# Patient Record
Sex: Male | Born: 1945 | Race: White | Hispanic: Yes | Smoking: Never smoker
Health system: Southern US, Community
[De-identification: ages and names within clinical notes are randomized; demographics above are authoritative.]

## PROBLEM LIST (undated history)

## (undated) DIAGNOSIS — G629 Polyneuropathy, unspecified: Secondary | ICD-10-CM

## (undated) DIAGNOSIS — K259 Gastric ulcer, unspecified as acute or chronic, without hemorrhage or perforation: Secondary | ICD-10-CM

## (undated) DIAGNOSIS — I10 Essential (primary) hypertension: Secondary | ICD-10-CM

## (undated) DIAGNOSIS — N289 Disorder of kidney and ureter, unspecified: Secondary | ICD-10-CM

## (undated) DIAGNOSIS — I4510 Unspecified right bundle-branch block: Secondary | ICD-10-CM

## (undated) HISTORY — PX: SHOULDER SURGERY: SHX246

## (undated) HISTORY — PX: HERNIA REPAIR: SHX51

## (undated) HISTORY — PX: KNEE ARTHROSCOPY: SUR90

## (undated) HISTORY — DX: Polyneuropathy, unspecified: G62.9

## (undated) HISTORY — PX: APPENDECTOMY: SHX54

---

## 2019-03-27 ENCOUNTER — Emergency Department (HOSPITAL_COMMUNITY): Payer: Medicare Other

## 2019-03-27 ENCOUNTER — Other Ambulatory Visit: Payer: Self-pay

## 2019-03-27 ENCOUNTER — Encounter (HOSPITAL_COMMUNITY): Payer: Self-pay

## 2019-03-27 ENCOUNTER — Emergency Department (HOSPITAL_COMMUNITY)
Admission: EM | Admit: 2019-03-27 | Discharge: 2019-03-27 | Disposition: A | Discharge status: Home / Self Care | Payer: Medicare Other | Attending: Emergency Medicine | Admitting: Emergency Medicine

## 2019-03-27 ENCOUNTER — Emergency Department (HOSPITAL_BASED_OUTPATIENT_CLINIC_OR_DEPARTMENT_OTHER): Payer: Medicare Other

## 2019-03-27 DIAGNOSIS — I129 Hypertensive chronic kidney disease with stage 1 through stage 4 chronic kidney disease, or unspecified chronic kidney disease: Secondary | ICD-10-CM | POA: Insufficient documentation

## 2019-03-27 DIAGNOSIS — R51 Headache: Secondary | ICD-10-CM | POA: Diagnosis not present

## 2019-03-27 DIAGNOSIS — R0602 Shortness of breath: Secondary | ICD-10-CM

## 2019-03-27 DIAGNOSIS — Z7982 Long term (current) use of aspirin: Secondary | ICD-10-CM | POA: Diagnosis not present

## 2019-03-27 DIAGNOSIS — R0789 Other chest pain: Secondary | ICD-10-CM

## 2019-03-27 DIAGNOSIS — N189 Chronic kidney disease, unspecified: Secondary | ICD-10-CM | POA: Insufficient documentation

## 2019-03-27 DIAGNOSIS — R52 Pain, unspecified: Secondary | ICD-10-CM

## 2019-03-27 DIAGNOSIS — Z79899 Other long term (current) drug therapy: Secondary | ICD-10-CM | POA: Insufficient documentation

## 2019-03-27 DIAGNOSIS — R030 Elevated blood-pressure reading, without diagnosis of hypertension: Secondary | ICD-10-CM

## 2019-03-27 DIAGNOSIS — M79662 Pain in left lower leg: Secondary | ICD-10-CM | POA: Insufficient documentation

## 2019-03-27 DIAGNOSIS — R519 Headache, unspecified: Secondary | ICD-10-CM

## 2019-03-27 HISTORY — DX: Unspecified right bundle-branch block: I45.10

## 2019-03-27 HISTORY — DX: Disorder of kidney and ureter, unspecified: N28.9

## 2019-03-27 HISTORY — DX: Essential (primary) hypertension: I10

## 2019-03-27 HISTORY — DX: Polyneuropathy, unspecified: G62.9

## 2019-03-27 HISTORY — DX: Gastric ulcer, unspecified as acute or chronic, without hemorrhage or perforation: K25.9

## 2019-03-27 LAB — CBC
HCT: 40.1 % (ref 39.0–52.0)
Hemoglobin: 13.3 g/dL (ref 13.0–17.0)
MCH: 30.1 pg (ref 26.0–34.0)
MCHC: 33.2 g/dL (ref 30.0–36.0)
MCV: 90.7 fL (ref 80.0–100.0)
Platelets: 188 10*3/uL (ref 150–400)
RBC: 4.42 MIL/uL (ref 4.22–5.81)
RDW: 13.7 % (ref 11.5–15.5)
WBC: 7.1 10*3/uL (ref 4.0–10.5)
nRBC: 0 % (ref 0.0–0.2)

## 2019-03-27 LAB — BASIC METABOLIC PANEL
Anion gap: 11 (ref 5–15)
BUN: 23 mg/dL (ref 8–23)
CO2: 22 mmol/L (ref 22–32)
Calcium: 9.6 mg/dL (ref 8.9–10.3)
Chloride: 104 mmol/L (ref 98–111)
Creatinine, Ser: 1.87 mg/dL — ABNORMAL HIGH (ref 0.61–1.24)
GFR calc Af Amer: 40 mL/min — ABNORMAL LOW (ref >60)
GFR calc non Af Amer: 35 mL/min — ABNORMAL LOW (ref >60)
Glucose, Bld: 121 mg/dL — ABNORMAL HIGH (ref 70–99)
Potassium: 4.4 mmol/L (ref 3.5–5.1)
Sodium: 137 mmol/L (ref 135–145)

## 2019-03-27 LAB — TROPONIN I: Troponin I: <0.03 ng/mL (ref <0.03)

## 2019-03-27 LAB — SEDIMENTATION RATE: Sed Rate: 5 mm/hr (ref 0–16)

## 2019-03-27 LAB — BRAIN NATRIURETIC PEPTIDE: B Natriuretic Peptide: 17.3 pg/mL (ref 0.0–100.0)

## 2019-03-27 MED ORDER — SODIUM CHLORIDE 0.9 % IV BOLUS
1000.0000 mL | Freq: Once | INTRAVENOUS | Status: AC
  Administered 2019-03-27: 1000 mL via INTRAVENOUS

## 2019-03-27 MED ORDER — SODIUM CHLORIDE 0.9% FLUSH
3.0000 mL | Freq: Once | INTRAVENOUS | Status: AC
  Administered 2019-03-27: 3 mL via INTRAVENOUS

## 2019-03-27 MED ORDER — PROCHLORPERAZINE EDISYLATE 10 MG/2ML IJ SOLN
10.0000 mg | Freq: Once | INTRAMUSCULAR | Status: AC
  Administered 2019-03-27: 10 mg via INTRAVENOUS
  Filled 2019-03-27: qty 2

## 2019-03-27 MED ORDER — DIPHENHYDRAMINE HCL 50 MG/ML IJ SOLN
25.0000 mg | Freq: Once | INTRAMUSCULAR | Status: AC
  Administered 2019-03-27: 25 mg via INTRAVENOUS
  Filled 2019-03-27: qty 1

## 2019-03-27 NOTE — ED Notes (Signed)
Patient transported to X-ray 

## 2019-03-27 NOTE — ED Notes (Signed)
Patient transported to CT 

## 2019-03-27 NOTE — ED Triage Notes (Signed)
Pt has multiple complaints Pt reports 1 week of intermittent headaches with HTN. Pt also reports discomfort on the left side of his chest with SOB and lightheadedness.  Woke up with pain his his left calf today, hx of PE and DVTs. Not taking any blood thinners  Traveled to [place] in October 2019, was quarantined due to similar symptoms.   Pt a.o, nad noted at this time.

## 2019-03-27 NOTE — ED Notes (Signed)
Vascular at bedside

## 2019-03-27 NOTE — ED Provider Notes (Signed)
[facility] EMERGENCY DEPARTMENT Provider Note   CSN: [id number] Arrival date & time: 03/27/19  1553    History   Chief Complaint Chief Complaint  Patient presents with   Headache   Chest Pain   Shortness of Breath   Leg Pain    HPI Hashim Eichhorst is a 73 y.o. male.     73yo M w/ PMH including HTN, CKD, PUD who p/w headache and HTN. 10 days ago he began having a headache. He reports h/o HTN and has gotten similar headaches in the past when his blood pressure is elevated. Usually his BP runs 110s/70s; checked pressure and it was 140/85. He doubled his doxazosin from 8 to  about 1 week ago. He also continues to take Cozaar. No improvement with the medication increase. He spoke with his PCP in [place] who instructed to continue those meds and he started another medication,  amlodipine. PCP also gave him lorazepam for stress. Pt noticed some improvement in BP. He reports some progressive SOB as well as some L sided chest discomfort. The pain radiates to his back and L shoulder. Sometimes it improves with exercise. He reports walking a few miles each day without problems.  This morning his headache was more severe, he was having some problems breathing, and had a cramp in his left calf which is what prompted him to come in. He states he feels uncomfortable and not his usual self. He denies cough/cold symptoms, fevers, vomiting. No vision changes, extremity weakness, balance problems, or numbness.  He denies any recent long travel or history of blood clots. He reports having similar symptoms last October for which he was admitted and had extensive w/u including ECHO, cardiac stress test, CTA chest which were normal.   The history is provided by the patient.  Headache  Chest Pain  Associated symptoms: headache and shortness of breath   Shortness of Breath  Associated symptoms: chest pain and headaches   Leg Pain    Past Medical History:  Diagnosis Date    Hypertension    Multiple gastric ulcers    Neuropathy    RBBB (right bundle branch block)    incomplete, 1st degree AV Block   Renal disorder     There are no active problems to display for this patient.   Past Surgical History:  Procedure Laterality Date   APPENDECTOMY     HERNIA REPAIR Left    inguinal    KNEE ARTHROSCOPY Right    x3   SHOULDER SURGERY          Home Medications    Prior to Admission medications   Medication Sig Start Date End Date Taking? Authorizing Provider  amLODipine (NORVASC) 5 MG tablet Take 5 mg by mouth daily. 03/16/19  Yes [provider]  aspirin EC 81 MG tablet Take 81 mg by mouth at bedtime.    Yes [provider]  doxazosin (CARDURA) 8 MG tablet Take 16 mg by mouth at bedtime.  02/28/19  Yes [provider]  gabapentin (NEURONTIN) 300 MG capsule Take 600 mg by mouth 2 (two) times daily.  02/28/19  Yes [provider]  lansoprazole (PREVACID) 15 MG capsule Take 15 mg by mouth 2 (two) times daily before a meal.   Yes [provider]  LORazepam (ATIVAN) 1 MG tablet Take 1 mg by mouth 2 (two) times daily as needed for anxiety.  03/14/19  Yes [provider]  losartan (COZAAR) 100 MG tablet Take 100  mg by mouth daily. 02/28/19  Yes [provider]  Multiple Vitamin (MULTIVITAMIN WITH MINERALS) TABS tablet Take 1 tablet by mouth daily.   Yes [provider]  triamcinolone cream (KENALOG) 0.1 % Apply 1 application topically 2 (two) times daily as needed (skin care).   Yes [provider]  ZENPEP 25000-79000 units CPEP Take 1-2 capsules by mouth See admin instructions. Take one capsule at lunch and two capsules at dinner, may add one addition capsule for heavy meals or red meat consumption. 12/25/18  Yes [provider]  atorvastatin (LIPITOR) 10 MG tablet Take 10 mg by mouth daily. 03/12/19   [provider]    Family History No family history on  file.  Social History Social History   Tobacco Use   Smoking status: Never Smoker   Smokeless tobacco: Never Used  Substance Use Topics   Alcohol use: Not Currently   Drug use: Never     Allergies   Patient has no known allergies.   Review of Systems Review of Systems  Respiratory: Positive for shortness of breath.   Cardiovascular: Positive for chest pain.  Neurological: Positive for headaches.  All other systems reviewed and are negative except that which was mentioned in HPI    Physical Exam Updated Vital Signs BP 111/80    Pulse 71    Temp 98.8 F (37.1 C) (Oral)    Resp 10    Ht 5\' 9"  (1.753 m)    Wt 79.4 kg    SpO2 96%    BMI 25.84 kg/m   Physical Exam Vitals signs and nursing note reviewed.  Constitutional:      General: He is not in acute distress.    Appearance: He is well-developed.     Comments: Awake, alert  HENT:     Head: Normocephalic and atraumatic.  Eyes:     Extraocular Movements: Extraocular movements intact.     Conjunctiva/sclera: Conjunctivae normal.     Pupils: Pupils are equal, round, and reactive to light.  Neck:     Musculoskeletal: Neck supple.  Cardiovascular:     Rate and Rhythm: Normal rate and regular rhythm.     Heart sounds: Normal heart sounds. No murmur.  Pulmonary:     Effort: Pulmonary effort is normal. No respiratory distress.     Breath sounds: Normal breath sounds.  Abdominal:     General: Bowel sounds are normal. There is no distension.     Palpations: Abdomen is soft.     Tenderness: There is no abdominal tenderness.  Skin:    General: Skin is warm and dry.  Neurological:     Mental Status: He is alert and oriented to person, place, and time.     Cranial Nerves: No cranial nerve deficit.     Motor: No abnormal muscle tone.     Deep Tendon Reflexes: Reflexes are normal and symmetric.     Comments: Fluent speech, normal finger-to-nose testing, negative pronator drift, no clonus 5/5 strength and normal  sensation x all 4 extremities  Psychiatric:        Thought Content: Thought content normal.        Judgment: Judgment normal.     Comments: Mildly anxious      ED Treatments / Results  Labs (all labs ordered are listed, but only abnormal results are displayed) Labs Reviewed  BASIC METABOLIC PANEL - Abnormal; Notable for the following components:      Result Value   Glucose, Bld 121 (*)  Creatinine, Ser 1.87 (*)    GFR calc non Af Amer 35 (*)    GFR calc Af Amer 40 (*)    All other components within normal limits  CBC  TROPONIN I  SEDIMENTATION RATE  BRAIN NATRIURETIC PEPTIDE    EKG EKG Interpretation  Date/Time:  Tuesday March 27 2019 16:00:20 EDT Ventricular Rate:  101 PR Interval:  164 QRS Duration: 92 QT Interval:  330 QTC Calculation: 427 R Axis:   66 Text Interpretation:  Sinus tachycardia Incomplete right bundle branch block Borderline ECG No previous ECGs available Confirmed by Frederick Peers [phone]) on 03/27/2019 5:01:15 PM   Radiology Dg Chest 2 View  Result Date: 03/27/2019 CLINICAL DATA:  Chest pain. EXAM: CHEST - 2 VIEW COMPARISON:  None. FINDINGS: The heart size and mediastinal contours are within normal limits. Both lungs are clear. No pneumothorax or pleural effusion is noted. The visualized skeletal structures are unremarkable. IMPRESSION: No active cardiopulmonary disease. Electronically Signed   By: Lupita Raider M.D.   On: 03/27/2019 16:29   Ct Head Wo Contrast  Result Date: 03/27/2019 CLINICAL DATA:  73 year old male with history of 1 week of mid intermittent headaches with hypertension. EXAM: CT HEAD WITHOUT CONTRAST TECHNIQUE: Contiguous axial images were obtained from the base of the skull through the vertex without intravenous contrast. COMPARISON:  No priors. FINDINGS: Brain: No evidence of acute infarction, hemorrhage, hydrocephalus, extra-axial collection or mass lesion/mass effect. Vascular: No hyperdense vessel or unexpected calcification.  Skull: Normal. Negative for fracture or focal lesion. Sinuses/Orbits: No acute finding. Other: None. IMPRESSION: 1. No acute intracranial abnormalities. The appearance of the brain is normal. Electronically Signed   By: Trudie Reed M.D.   On: 03/27/2019 18:00   Vas [place] Lower Extremity Venous (dvt) (mc And Wl 7a-7p)  Result Date: 03/27/2019  Lower Venous Study Indications: Pain, and SOB.  Risk Factors: History of pe. Performing Technologist: Toma Deiters RVS  Examination Guidelines: A complete evaluation includes B-mode imaging, spectral Doppler, color Doppler, and power Doppler as needed of all accessible portions of each vessel. Bilateral testing is considered an integral part of a complete examination. Limited examinations for reoccurring indications may be performed as noted.  +-----+---------------+---------+-----------+----------+-------+  RIGHT Compressibility Phasicity Spontaneity Properties Summary  +-----+---------------+---------+-----------+----------+-------+  CFV   Full            Yes       Yes                             +-----+---------------+---------+-----------+----------+-------+  SFJ   Full                                                      +-----+---------------+---------+-----------+----------+-------+   +---------+---------------+---------+-----------+----------+-------+  LEFT      Compressibility Phasicity Spontaneity Properties Summary  +---------+---------------+---------+-----------+----------+-------+  CFV       Full            Yes       Yes                             +---------+---------------+---------+-----------+----------+-------+  SFJ       Full                                                      +---------+---------------+---------+-----------+----------+-------+  FV Prox   Full            Yes       Yes                             +---------+---------------+---------+-----------+----------+-------+  FV Mid    Full                                                       +---------+---------------+---------+-----------+----------+-------+  FV Distal Full            Yes       Yes                             +---------+---------------+---------+-----------+----------+-------+  PFV       Full            Yes       Yes                             +---------+---------------+---------+-----------+----------+-------+  POP       Full            Yes       Yes                             +---------+---------------+---------+-----------+----------+-------+  PTV       Full                                                      +---------+---------------+---------+-----------+----------+-------+  PERO      Full                                                      +---------+---------------+---------+-----------+----------+-------+     Summary: Right: There is no evidence of a common femoral vein obstructuin. Left: There is no evidence of deep vein thrombosis in the lower extremity. No cystic structure found in the popliteal fossa.  *See table(s) above for measurements and observations. Electronically signed by Waverly Ferrari MD on 03/27/2019 at 5:47:53 PM.    Final     Procedures Procedures (including critical care time)  Medications Ordered in ED Medications  sodium chloride flush (NS) 0.9 % injection 3 mL (3 mLs Intravenous Given 03/27/19 1801)  sodium chloride 0.9 % bolus 1,000 mL (0 mLs Intravenous Stopped 03/27/19 1859)  diphenhydrAMINE (BENADRYL) injection 25 mg (25 mg Intravenous Given 03/27/19 1801)  prochlorperazine (COMPAZINE) injection 10 mg (10 mg Intravenous Given 03/27/19 1801)     Initial Impression / Assessment and Plan / ED Course  I have reviewed the triage vital signs and the nursing notes.  Pertinent labs & imaging results that were available during my care of the patient were reviewed by me and considered in my medical decision making (see chart for details).       he was well-appearing  on exam, normal neurologic exam and normal vital signs.  No red  flag symptoms to suggest subarachnoid hemorrhage.  CT of head unremarkable.  Ultrasound of leg negative for DVT.  His lab work shows normal troponin, normal BNP, creatinine 1.87 which just mildly elevated from previous values on paperwork that he has from previous hospitalization.  I reviewed his previous hospitalization paperwork when he was admitted last fall for similar symptoms.  He had an extensive cardiac and pulmonary work-up and was negative for PE, normal stress testing, normal ECHO, and no concerning findings during hospitalization. No risk factors for PE currently and normal VS make PE very unlikely.   Because his blood pressure is been normal here and he has had a slight increase in his creatinine, I recommended holding his losartan and discussing with his PCP.  He is planning on being in [place] for a while so I strongly recommended establishing medical care here.  He ultimately would benefit from evaluation from nephrology.  Discussed the importance of close PCP follow-up for trending his blood pressures and creatinine.  He is well-appearing on reassessment.  I have extensively reviewed return precautions and he is voiced understanding.  Final Clinical Impressions(s) / ED Diagnoses   Final diagnoses:  Nonintractable episodic headache, unspecified headache type  Elevated blood pressure reading  Chronic kidney disease, unspecified CKD stage  Atypical chest pain  Shortness of breath    ED Discharge Orders    None       Beverly Ferner, Ambrose [place], MD 03/28/19 [phone]

## 2019-03-27 NOTE — Discharge Instructions (Addendum)
Please hold your Cozaar  for now and continue taking doxazosin and amlodipine. If your blood pressure remains high, you can restart Cozaar at half dose (50mg ). Contact a primary care clinic to follow up as soon as possible and have kidney function and other symptoms rechecked.  Return to ER immediately for any worsening symptoms or strokelike symptoms.

## 2019-03-27 NOTE — Progress Notes (Signed)
Left lower extremity venous duplex completed. Preliminary results in Chart review CV Proc. [employer], RVS 03/27/2019, 5:24 PM

## 2019-07-07 DIAGNOSIS — R931 Abnormal findings on diagnostic imaging of heart and coronary circulation: Secondary | ICD-10-CM

## 2019-07-07 HISTORY — DX: Abnormal findings on diagnostic imaging of heart and coronary circulation: R93.1

## 2019-08-22 ENCOUNTER — Encounter: Payer: Self-pay | Admitting: Neurology

## 2019-08-22 ENCOUNTER — Telehealth: Payer: Self-pay | Admitting: Neurology

## 2019-08-22 ENCOUNTER — Other Ambulatory Visit: Payer: Self-pay

## 2019-08-22 ENCOUNTER — Ambulatory Visit (INDEPENDENT_AMBULATORY_CARE_PROVIDER_SITE_OTHER): Payer: Medicare Other | Admitting: Neurology

## 2019-08-22 ENCOUNTER — Encounter

## 2019-08-22 VITALS — BP 125/79 | HR 76 | Temp 97.7°F | Ht 69.0 in | Wt 174.5 lb

## 2019-08-22 DIAGNOSIS — M25571 Pain in right ankle and joints of right foot: Secondary | ICD-10-CM | POA: Diagnosis not present

## 2019-08-22 DIAGNOSIS — R202 Paresthesia of skin: Secondary | ICD-10-CM

## 2019-08-22 DIAGNOSIS — M25572 Pain in left ankle and joints of left foot: Secondary | ICD-10-CM | POA: Diagnosis not present

## 2019-08-22 DIAGNOSIS — G8929 Other chronic pain: Secondary | ICD-10-CM

## 2019-08-22 DIAGNOSIS — R413 Other amnesia: Secondary | ICD-10-CM | POA: Diagnosis not present

## 2019-08-22 MED ORDER — GABAPENTIN 300 MG PO CAPS: 600.0000 mg | ORAL_CAPSULE | Freq: Two times a day (BID) | ORAL | 11 refills | Status: AC

## 2019-08-22 MED ORDER — DULOXETINE HCL 30 MG PO CPEP: 30.0000 mg | ORAL_CAPSULE | Freq: Every day | ORAL | 11 refills | Status: DC

## 2019-08-22 NOTE — Addendum Note (Signed)
Addended by: Marcial Pacas on: 08/22/2019 03:18 PM   Modules accepted: Orders

## 2019-08-22 NOTE — Progress Notes (Addendum)
PATIENT: Calvin Stephens DOB: [birth year]  Chief Complaint  Patient presents with  . Polyneuropathy    Reports progressive tingling in hands and feet.  He is taking gabapentin 628m BID.  His dose has gradually increased over the years.    .Marland KitchenPCP    SWilley Blade MD     HISTORICAL  ABland Rudzinskiis a 73year old male, seen in request by his primary care physician Dr. SWilley Bladefor evaluation of numbness and tingling his hands and feet, initial evaluation was on August 22, 2019.  I have reviewed and summarized the referring note from the referring physician.  He has past medical history of hypertension, began to notice bilateral medial ankle discomfort around 2005, it remained at bilateral medial ankle, symmetric, he described as deep discomfort, [facility] nerve conduction study, which showed no significant abnormality, he was treated with gabapentin initially only 300 mg daily, over the years, he has to titrating up gabapentin, currently 1200 mg daily to have symptoms under reasonable control  Since 2019, he also noticed intermittent bilateral fingertips numbness tingling, he complains of right shoulder, neck tightness, he contributed to her right rotator cuff surgery, he does has mild low back pain, which chronic for over few decades, he denies gait abnormality, denies bowel and bladder incontinence.  He denies bilateral foot, especially plantar surface numbness tingling.  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  REVIEW OF SYSTEMS: Full 14 system review of systems performed and notable only for as above All other review of systems were negative.  ALLERGIES: No Known Allergies  HOME MEDICATIONS: Current Outpatient Medications  Medication Sig Dispense Refill  . amLODipine (NORVASC) 5 MG tablet Take 5 mg by mouth daily.    .Marland Kitchenaspirin EC 81 MG tablet Take 81 mg by mouth at bedtime.     .Marland Kitchendoxazosin (CARDURA) 8 MG tablet Take 16 mg by mouth at bedtime.     . gabapentin (NEURONTIN) 300 MG capsule Take 600 mg by mouth 2 (two) times daily.     . lansoprazole (PREVACID) 15 MG capsule Take 15 mg by mouth 2 (two) times daily before a meal.    . LORazepam (ATIVAN) 1 MG tablet Take 1 mg by mouth 2 (two) times daily as needed for anxiety.     .Marland Kitchenlosartan (COZAAR) 100 MG tablet Take 100 mg by mouth daily.    . Multiple Vitamin (MULTIVITAMIN WITH MINERALS) TABS tablet Take 1 tablet by mouth daily.    .Marland Kitchentriamcinolone cream (KENALOG) 0.1 % Apply 1 application topically 2 (two) times daily as needed (skin care).    .Marland KitchenZENPEP 25000-79000 units CPEP Take 1-2 capsules by mouth See admin instructions. Take one capsule at lunch and two capsules at dinner, may add one addition capsule for heavy meals or red meat consumption.     No current facility-administered medications for this visit.     PAST MEDICAL HISTORY: Past Medical History:  Diagnosis Date  . Hypertension   . Multiple gastric ulcers   . Neuropathy   .  Polyneuropathy   . RBBB (right bundle branch block)    incomplete, 1st degree AV Block  . Renal disorder     PAST SURGICAL HISTORY: Past Surgical History:  Procedure Laterality Date  . APPENDECTOMY    . HERNIA REPAIR Left    inguinal   . KNEE ARTHROSCOPY Right    x3  . SHOULDER SURGERY      FAMILY HISTORY: Family History  Problem Relation Age of Onset  . Kidney disease Mother   . Hypertension Father     SOCIAL HISTORY: Social History   Socioeconomic History  . Marital  status: Widowed    Spouse name: Not on file  . Number of children: 2  . Years of education: college  . Highest education level: Not on file  Occupational History  . 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 Financial resource strain: Not on file  . Food insecurity    Worry: Not on file    Inability: Not on file  . Transportation needs    Medical: Not on file    Non-medical: Not on file  Tobacco Use  . Smoking status: Never Smoker  . Smokeless tobacco: Never Used  Substance and Sexual Activity  . Alcohol use: Not Currently  . Drug use: Never  . Sexual activity: Not on file  Lifestyle  . Physical activity    Days per week: Not on file    Minutes per session: Not on file  . Stress: Not on file  Relationships  . Social Herbalist on phone: Not on file    Gets together: Not on file    Attends religious service: Not on file    Active member of club or organization: Not on file    Attends meetings of clubs or organizations: Not on file    Relationship status: Not on file  . Intimate partner violence    Fear of current or ex partner: Not on file    Emotionally abused: Not on file    Physically abused: Not on file    Forced sexual activity: Not on file  Other Topics Concern  . Not on file  Social History Narrative   College educated: MD, MBA   Lives with significant other.   No caffeine use.   Right-handed.     PHYSICAL EXAM   Vitals:   08/22/19 0743  BP: 125/79  Pulse: 76  Temp: 97.7 F (36.5 C)  Weight: 174 lb 8 oz (79.2 kg)  Height: 5' 9"  (1.753 m)    Not recorded      Body mass index is 25.77 kg/m.  PHYSICAL EXAMNIATION:  Gen: NAD, conversant, well nourised, well groomed                     Cardiovascular: Regular rate rhythm, no peripheral edema, warm, nontender. Eyes: Conjunctivae clear without exudates or hemorrhage Neck: Supple, no carotid bruits. Pulmonary: Clear to auscultation bilaterally   NEUROLOGICAL  EXAM:  MENTAL STATUS: Speech:    Speech is normal; fluent and spontaneous with normal comprehension.  Cognition:     Orientation to time, place and person     Normal recent and remote memory     Normal Attention span and concentration     Normal Language, naming, repeating,spontaneous speech     Fund of knowledge   CRANIAL NERVES: CN II: Visual fields are full to confrontation.  Pupils are round equal and briskly reactive  to light. CN III, IV, VI: extraocular movement are normal. No ptosis. CN V: Facial sensation is intact to pinprick in all 3 divisions bilaterally. Corneal responses are intact.  CN VII: Face is symmetric with normal eye closure and smile. CN VIII: Hearing is normal to causal conversation. CN IX, X: Palate elevates symmetrically. Phonation is normal. CN XI: Head turning and shoulder shrug are intact CN XII: Tongue is midline with normal movements and no atrophy.  MOTOR: There is no pronator drift of out-stretched arms. Muscle bulk and tone are normal. Muscle strength is normal.  REFLEXES: Reflexes are 2+ and symmetric at the biceps, triceps, knees, and ankles. Plantar responses are flexor.  SENSORY: Intact to light touch, pinprick, positional sensation and vibratory sensation are intact in fingers and toes.  COORDINATION: Rapid alternating movements and fine finger movements are intact. There is no dysmetria on finger-to-nose and heel-knee-shin.    GAIT/STANCE: Posture is normal. Gait is steady with normal steps, base, arm swing, and turning. Heel and toe walking are normal. Tandem gait is normal.  Romberg is absent.   DIAGNOSTIC DATA (LABS, IMAGING, TESTING) - I reviewed patient records, labs, notes, testing and imaging myself where available.   ASSESSMENT AND PLAN  Esias Mory is a 73 y.o. male   Bilateral upper and lower extremity paresthesia  The history are not typical for peripheral neuropathy,  Proceed with EMG nerve conduction study, also MRI  of cervical spine to rule out cervical spondylitic disease  Laboratory evaluation to rule out treatable etiology  Add on cymbalta 71m daily  Short-term memory loss  MRI of brain   YMarcial Pacas M.D. Ph.D.  [facility] [address] [place] [id number]: [phone]: [phone] CC: SWilley Blade MD

## 2019-08-22 NOTE — Addendum Note (Signed)
Addended by: Marcial Pacas on: 08/22/2019 01:27 PM   Modules accepted: Orders

## 2019-08-22 NOTE — Telephone Encounter (Signed)
Medicare/aarp order sent to GI. No auth they will reach out to the patient to schedule.  

## 2019-08-22 NOTE — Patient Instructions (Signed)
Duloxetine hydrochloride, a selective serotonin and norepinephrine reuptake inhibitor, exerts its antidepressant and pain inhibitory actions by potentiating the serotonergic and noradrenergic activity in the CNS.  Common side effect  Dermatologic: Diaphoresis (Up to 6% )  Endocrine metabolic: Weight loss (Adult, 1% or greater; pediatric, 14% to 15%% )  Gastrointestinal: Constipation (9% to 10% ), Decrease in appetite (Adult, 6% to 8%; pediatric, 10% to 15% ), Diarrhea (6% to 9% ), Nausea (18% to 25% ), Vomiting (Adult, 3% to 4%; pediatric, 9% to 15% ), Xerostomia (Adult, 11% to 14%; pediatric, 2% )  Neurologic: Hypersomnia, Sedated (Pediatric, 9% ), Somnolence  Other: Fatigue (Pediatric, 5% )

## 2019-08-24 LAB — PROTEIN ELECTROPHORESIS, SERUM
A/G Ratio: 1.4 (ref 0.7–1.7)
Albumin ELP: 4 g/dL (ref 2.9–4.4)
Alpha 1: 0.2 g/dL (ref 0.0–0.4)
Alpha 2: 0.8 g/dL (ref 0.4–1.0)
Beta: 1 g/dL (ref 0.7–1.3)
Gamma Globulin: 0.9 g/dL (ref 0.4–1.8)
Globulin, Total: 2.8 g/dL (ref 2.2–3.9)
Total Protein: 6.8 g/dL (ref 6.0–8.5)

## 2019-08-24 LAB — SEDIMENTATION RATE: Sed Rate: 7 mm/hr (ref 0–30)

## 2019-08-24 LAB — HEMOGLOBIN A1C
Est. average glucose Bld gHb Est-mCnc: 111 mg/dL
Hgb A1c MFr Bld: 5.5 % (ref 4.8–5.6)

## 2019-08-24 LAB — RPR: RPR Ser Ql: NONREACTIVE

## 2019-08-24 LAB — ANA W/REFLEX IF POSITIVE: Anti Nuclear Antibody (ANA): NEGATIVE

## 2019-08-24 LAB — CK: Total CK: 71 U/L (ref 41–331)

## 2019-09-08 ENCOUNTER — Other Ambulatory Visit: Payer: Self-pay

## 2019-09-08 ENCOUNTER — Ambulatory Visit
Admission: RE | Admit: 2019-09-08 | Discharge: 2019-09-08 | Disposition: A | Discharge status: Home / Self Care | Payer: Medicare Other | Source: Ambulatory Visit | Attending: Neurology | Admitting: Neurology

## 2019-09-08 DIAGNOSIS — R202 Paresthesia of skin: Secondary | ICD-10-CM

## 2019-09-08 DIAGNOSIS — R413 Other amnesia: Secondary | ICD-10-CM

## 2019-09-08 DIAGNOSIS — G8929 Other chronic pain: Secondary | ICD-10-CM

## 2019-09-08 DIAGNOSIS — M25572 Pain in left ankle and joints of left foot: Secondary | ICD-10-CM

## 2019-09-10 ENCOUNTER — Telehealth: Payer: Self-pay | Admitting: Neurology

## 2019-09-10 NOTE — Telephone Encounter (Signed)
I spoke to the patient and he verbalized understanding of the results below.  He will keep his pending appt for NCV/EMG on 10/03/2019.

## 2019-09-10 NOTE — Telephone Encounter (Signed)
Please call patient, MRI of brain showed mild supratentorium small vessel disease, there is no acute abnormalities.  MRI of cervical spine showed multiple level degenerative changes, no evidence of spinal cord or nerve root compression  IMPRESSION: This MRI of the brain without contrast shows the following: 1.   Brain volume is normal for age. 2.   Some scattered T2/flair hyperintense foci in the hemispheres consistent with minimal chronic microvascular ischemic change, typical for age. 3.   Minimal chronic sinusitis.   4.   There are no acute findings.  IMPRESSION: This MRI of the cervical spine without contrast shows the following: 1.   The spinal cord appears normal.  There is no spinal stenosis. 2.   Multilevel degenerative changes as detailed above, most pronounced at C4-C5 and C5-C6.  There is moderate foraminal narrowing to the right at C3-C4 and bilaterally at C5-C6 but there is no definite nerve root compression

## 2019-09-10 NOTE — Telephone Encounter (Signed)
Left message requesting a return call to review results.

## 2019-09-13 ENCOUNTER — Other Ambulatory Visit: Payer: Self-pay | Admitting: Neurology

## 2019-10-03 ENCOUNTER — Encounter (INDEPENDENT_AMBULATORY_CARE_PROVIDER_SITE_OTHER): Payer: Medicare Other | Admitting: Neurology

## 2019-10-03 ENCOUNTER — Other Ambulatory Visit: Payer: Self-pay

## 2019-10-03 ENCOUNTER — Ambulatory Visit (INDEPENDENT_AMBULATORY_CARE_PROVIDER_SITE_OTHER): Payer: Medicare Other | Admitting: Neurology

## 2019-10-03 DIAGNOSIS — R413 Other amnesia: Secondary | ICD-10-CM

## 2019-10-03 DIAGNOSIS — M25571 Pain in right ankle and joints of right foot: Secondary | ICD-10-CM | POA: Diagnosis not present

## 2019-10-03 DIAGNOSIS — M25572 Pain in left ankle and joints of left foot: Secondary | ICD-10-CM | POA: Diagnosis not present

## 2019-10-03 DIAGNOSIS — G8929 Other chronic pain: Secondary | ICD-10-CM

## 2019-10-03 DIAGNOSIS — R202 Paresthesia of skin: Secondary | ICD-10-CM

## 2019-10-03 DIAGNOSIS — Z0289 Encounter for other administrative examinations: Secondary | ICD-10-CM

## 2019-10-03 NOTE — Procedures (Signed)
Full Name: Calvin Stephens Gender: Male MRN #: [id number] Date of Birth: 10/16/1946    Visit Date: 10/03/2019 07:13 Age: 73 Years 67 Months Old Examining Physician: Marcial Pacas, MD  Referring Physician: Marcial Pacas, MD History:   73 year old male with chronic intermittent bilateral ankle paresthesia, intermittent involvement of upper extremity  Summary of the tests: Nerve conduction study: Bilateral sural, superficial peroneal sensory responses were normal.  Left median, ulnar sensory responses were normal.  Bilateral tibial, peroneal to EDB, left median, ulnar motor responses were normal.  Electromyography: Selected needle examinations of left lower extremity muscles, and upper extremity muscles were normal.  Conclusion: This is a normal study.  There is no electrodiagnostic evidence of large fiber peripheral neuropathy.    ------------------------------- Marcial Pacas, M.D. PhD  [facility] [place], [place] [address] Tel: [phone] Fax: [phone]        [facility]    Nerve / Sites Muscle Latency Ref. Amplitude Ref. Rel Amp Segments Distance Velocity Ref. Area    ms ms mV mV %  cm m/s m/s mVms  L Median - APB     Wrist APB 3.0 ?4.4 8.7 ?4.0 100 Wrist - APB 7   25.2     Upper arm APB 7.0  8.2  93.3 Upper arm - Wrist 22 55 ?49 23.1  L Ulnar - ADM     Wrist ADM 2.6 ?3.3 10.5 ?6.0 100 Wrist - ADM 7   29.1     B.Elbow ADM 6.1  9.2  88.4 B.Elbow - Wrist 20 56 ?49 28.2     A.Elbow ADM 8.1  8.9  96.2 A.Elbow - B.Elbow 10 52 ?49 28.9         A.Elbow - Wrist      L Peroneal - EDB     Ankle EDB 4.7 ?6.5 2.8 ?2.0 100 Ankle - EDB 9   9.6     Fib head EDB 11.3  2.2  81.2 Fib head - Ankle 29 44 ?44 8.8     Pop fossa EDB 13.5  2.1  92 Pop fossa - Fib head 10 44 ?44 8.5         Pop fossa - Ankle      R Peroneal - EDB     Ankle EDB 4.8 ?6.5 2.3 ?2.0 100 Ankle - EDB 9   7.4     Fib head EDB 11.5  2.5  109 Fib head - Ankle 29 44 ?44 8.6     Pop fossa EDB  13.8  2.3  94.5 Pop fossa - Fib head 10 44 ?44 8.3         Pop fossa - Ankle      L Tibial - AH     Ankle AH 3.8 ?5.8 7.0 ?4.0 100 Ankle - AH 9   14.8     Pop fossa AH 13.4  5.1  73.3 Pop fossa - Ankle 39 41 ?41 18.3  R Tibial - AH     Ankle AH 3.9 ?5.8 9.7 ?4.0 100 Ankle - AH 9   21.1     Pop fossa AH 13.4  6.9  71.8 Pop fossa - Ankle 39 41 ?41 21.8                      SNC    Nerve / Sites Rec. Site Peak Lat Ref.  Amp Ref. Segments Distance    ms ms V V  cm  L Sural - Ankle (Calf)     Calf Ankle 4.0 ?4.4 6 ?6 Calf - Ankle 14  R Sural - Ankle (Calf)     Calf Ankle 3.4 ?4.4 10 ?6 Calf - Ankle 14  L Superficial peroneal - Ankle     Lat leg Ankle 4.2 ?4.4 7 ?6 Lat leg - Ankle 14  R Superficial peroneal - Ankle     Lat leg Ankle 4.1 ?4.4 8 ?6 Lat leg - Ankle 14  L Median - Orthodromic (Dig II, Mid palm)     Dig II Wrist 2.9 ?3.4 15 ?10 Dig II - Wrist 13  L Ulnar - Orthodromic, (Dig V, Mid palm)     Dig V Wrist 2.6 ?3.1 6 ?5 Dig V - Wrist 26                  F  Wave    Nerve F Lat Ref.   ms ms  L Tibial - AH 53.2 ?56.0  R Tibial - AH 52.7 ?56.0  L Ulnar - ADM 30.7 ?32.0           EMG       EMG Summary Table    Spontaneous MUAP Recruitment  Muscle IA Fib PSW Fasc Other Amp Dur. Poly Pattern  L. Tibialis anterior Normal None None None _______ Normal Normal Normal Normal  L. Tibialis posterior Normal None None None _______ Normal Normal Normal Normal  L. Peroneus longus Normal None None None _______ Normal Normal Normal Normal  L. Gastrocnemius (Medial head) Normal None None None _______ Normal Normal Normal Normal  L. Vastus lateralis Normal None None None _______ Normal Normal Normal Normal  L. Abductor hallucis Normal None None None _______ Normal Normal Normal Normal  L. First dorsal interosseous Normal None None None _______ Normal Normal Normal Normal  L. Pronator teres Normal None None None _______ Normal Normal Normal Normal  L. Brachioradialis Normal None None  None _______ Normal Normal Normal Normal  L. Biceps brachii Normal None None None _______ Normal Normal Normal Normal  L. Extensor digitorum communis Normal None None None _______ Normal Normal Normal Normal

## 2019-10-07 HISTORY — PX: TRANSTHORACIC ECHOCARDIOGRAM: SHX275

## 2019-10-10 ENCOUNTER — Institutional Professional Consult (permissible substitution): Payer: Medicare Other | Admitting: Pulmonary Disease

## 2019-10-18 ENCOUNTER — Encounter: Payer: Self-pay | Admitting: Pulmonary Disease

## 2019-10-18 ENCOUNTER — Ambulatory Visit (INDEPENDENT_AMBULATORY_CARE_PROVIDER_SITE_OTHER): Payer: Medicare Other

## 2019-10-18 ENCOUNTER — Other Ambulatory Visit: Payer: Self-pay

## 2019-10-18 ENCOUNTER — Ambulatory Visit (INDEPENDENT_AMBULATORY_CARE_PROVIDER_SITE_OTHER): Payer: Medicare Other | Admitting: Pulmonary Disease

## 2019-10-18 VITALS — BP 108/64 | HR 83 | Temp 97.3°F | Ht 69.0 in | Wt 177.0 lb

## 2019-10-18 DIAGNOSIS — R0602 Shortness of breath: Secondary | ICD-10-CM

## 2019-10-18 DIAGNOSIS — N189 Chronic kidney disease, unspecified: Secondary | ICD-10-CM | POA: Insufficient documentation

## 2019-10-18 DIAGNOSIS — J90 Pleural effusion, not elsewhere classified: Secondary | ICD-10-CM

## 2019-10-18 DIAGNOSIS — K8689 Other specified diseases of pancreas: Secondary | ICD-10-CM

## 2019-10-18 DIAGNOSIS — N289 Disorder of kidney and ureter, unspecified: Secondary | ICD-10-CM | POA: Insufficient documentation

## 2019-10-18 NOTE — Patient Instructions (Signed)
Today, we reviewed imaging reports from April and October 2020. In April, your CXR had no evidence of pleural effusions. Your MRI report mentions tiny bilateral effusions. These effusions can be related to inflammation related to infection or adjacent organs. It could also be a sign of cardiac issues. We will further evaluate with a repeat CXR and an echocardiogram (ultrasound of the heart).

## 2019-10-18 NOTE — Progress Notes (Signed)
   Subjective:    Patient ID: Calvin Stephens, male    DOB: [birth year], 73 y.o.   MRN: [id number]  HPI    Review of Systems  Constitutional: Negative for fever and unexpected weight change.  HENT: Negative for congestion, dental problem, ear pain, nosebleeds, postnasal drip, rhinorrhea, sinus pressure, sneezing, sore throat and trouble swallowing.   Eyes: Negative for redness and itching.  Respiratory: Negative for cough, chest tightness, shortness of breath and wheezing.   Cardiovascular: Negative for palpitations and leg swelling.  Gastrointestinal: Negative for nausea and vomiting.  Genitourinary: Negative for dysuria.  Musculoskeletal: Negative for joint swelling.  Skin: Negative for rash.  Allergic/Immunologic: Negative.  Negative for environmental allergies, food allergies and immunocompromised state.  Neurological: Negative for headaches.  Hematological: Does not bruise/bleed easily.  Psychiatric/Behavioral: Negative for dysphoric mood. The patient is not nervous/anxious.        Objective:   Physical Exam        Assessment & Plan:

## 2019-10-18 NOTE — Progress Notes (Signed)
Subjective:   PATIENT ID: Calvin Stephens GENDER: male DOB: 10/31/1946, MRN: [id number]   HPI  Chief Complaint  Patient presents with  . Pulmonary Consult    Referred by Dr Andi Devon, MD for eval of pleural effusion. Pt states this was found incidentally during workup for abdomen pain.    Reason for Visit: New consult for pleural effusions  [name] is a a 73 year old male never smoker with GERD, chronic renal insufficiency, pancreatic insufficiency and HTN who presents for new consult for pleural effusions.  Records reviewed from PCP on 10/01/19. he has been recently seeing his PCP for abdominal pain and 3 weeks ago he had a MR abdomen pelvis which demonstrated small bilateral pleural effusions. The last 1 to 2 years he has reported bilateral upper abdominal pain just right below his rib cage that is sharp and stabbing. He was diagnosed with pancreatic insufficiency and started on Zenpep.  In October 2019, he returned from [place] for work and presented to ED for URI and demonstrated bilateraly lower lobe effusions. 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 He again had pleural effusions seen incidentally for work-up of his abdominal pain and was referred to Pulmonary for further evaluation. He denies recent fevers, chills, cough, shortness of breath, congestion, wheezing or chest pain. Does report lower abdominal pain below the ribs on both sides.  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  Past Medical History:  Diagnosis Date  . Hypertension   . Multiple gastric ulcers   . Neuropathy   .  Polyneuropathy   . RBBB (right bundle branch block)    incomplete, 1st degree AV Block  . Renal disorder      Family History  Problem Relation Age of Onset  . Kidney disease Mother   . Hypertension Father      Social History   Occupational History  . 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 Smoking status: Never Smoker  . Smokeless tobacco: Never Used  Substance and Sexual Activity  . Alcohol use: Yes    Alcohol/week: 2.0 standard drinks    Types: 2 Glasses of wine per week  . Drug use: Never  . Sexual activity: Not on file    No Known Allergies   Outpatient Medications Prior to Visit  Medication Sig Dispense Refill  . amLODipine (NORVASC) 5 MG tablet Take 5 mg by mouth daily.    Marland Kitchen aspirin EC 81 MG tablet Take 81 mg by mouth at bedtime.     Marland Kitchen doxazosin (CARDURA) 8 MG tablet Take 4 mg by mouth daily.    Marland Kitchen gabapentin (NEURONTIN) 300 MG capsule Take 2 capsules (600 mg total) by mouth 2 (two) times daily. (Patient taking differently: Take 600 mg by mouth 3 (three) times daily. ) 120 capsule 11  . lansoprazole (PREVACID) 15 MG capsule Take 15 mg by mouth 2 (two) times daily before a meal.    . LORazepam (ATIVAN) 1 MG tablet Take 1 mg by mouth 2 (two) times daily as needed for anxiety.     Marland Kitchen losartan (COZAAR) 100 MG tablet Take 100 mg by mouth daily.    Marland Kitchen  Multiple Vitamin (MULTIVITAMIN WITH MINERALS) TABS tablet Take 1 tablet by mouth daily.    Marland Kitchen. triamcinolone cream (KENALOG) 0.1 % Apply 1 application topically 2 (two) times daily as needed (skin care).    Marland Kitchen. ZENPEP 25000-79000 units CPEP Take 1-2 capsules by mouth See admin instructions. Take one capsule at lunch and two capsules at dinner, may add one addition capsule for heavy meals or red meat consumption.    Marland Kitchen. doxazosin (CARDURA) 8 MG tablet Take 16 mg by mouth at bedtime.     . DULoxetine (CYMBALTA) 30 MG capsule TAKE 1 CAPSULE BY MOUTH EVERY DAY 90 capsule 3   No facility-administered medications  prior to visit.     Review of Systems  Constitutional: Negative for chills, diaphoresis, fever, malaise/fatigue and weight loss.  HENT: Negative for congestion, ear pain and sore throat.   Respiratory: Negative for cough, hemoptysis, sputum production, shortness of breath and wheezing.   Cardiovascular: Negative for chest pain, palpitations and leg swelling.  Gastrointestinal: Positive for abdominal pain. Negative for heartburn and nausea.  Genitourinary: Negative for frequency.  Musculoskeletal: Negative for joint pain and myalgias.  Skin: Negative for itching and rash.  Neurological: Negative for dizziness, weakness and headaches.  Endo/Heme/Allergies: Does not bruise/bleed easily.  Psychiatric/Behavioral: Negative for depression. The patient is not nervous/anxious.      Objective:   Vitals:   10/18/19 0939  BP: 108/64  Pulse: 83  Temp: (!) 97.3 F (36.3 C)  TempSrc: Temporal  SpO2: 95%  Weight: 177 lb (80.3 kg)  Height: 5\' 9"  (1.753 m)   SpO2: 95 %(on RA) O2 Device: None (Room air)  Physical Exam: General: Well-appearing, no acute distress HENT: [place], AT Eyes: EOMI, no scleral icterus Respiratory: Clear to auscultation bilaterally.  No crackles, wheezing or rales Cardiovascular: RRR, -M/R/G, no JVD GI: BS+, soft, nontender Extremities:-Edema,-tenderness Neuro: AAO x4, CNII-XII grossly intact Skin: Intact, no rashes or bruising Psych: Normal mood, normal affect  Data Reviewed:  Imaging: OSH MRI Abdomen and Pelvis 09/12/19 IMPRESSION:  1. Motion and lack of IV contrast degradation.  2. Fluid-filled stomach. This could relate to recent meal or be  secondary to the clinical history of gastric outlet obstruction.  3. No other explanation for patient's symptoms.  4. Foci of nonspecific T2 hypointensity within the right prostatic  peripheral zone. Nonspecific on this nondedicated study. Consider  correlation with PSA level and physical exam.  5. Aortic  Atherosclerosis (ICD10-I70.0).  6. Tiny bilateral pleural effusions.   CXR 03/27/19  No infiltrate, effusion or edema  PFT: None on file  Labs: CBC    Component Value Date/Time   WBC 7.1 03/27/2019 1617   RBC 4.42 03/27/2019 1617   HGB 13.3 03/27/2019 1617   HCT 40.1 03/27/2019 1617   PLT 188 03/27/2019 1617   MCV 90.7 03/27/2019 1617   MCH 30.1 03/27/2019 1617   MCHC 33.2 03/27/2019 1617   RDW 13.7 03/27/2019 1617   BMET    Component Value Date/Time   NA 137 03/27/2019 1617   K 4.4 03/27/2019 1617   CL 104 03/27/2019 1617   CO2 22 03/27/2019 1617   GLUCOSE 121 (H) 03/27/2019 1617   BUN 23 03/27/2019 1617   CREATININE 1.87 (H) 03/27/2019 1617   CALCIUM 9.6 03/27/2019 1617   GFRNONAA 35 (L) 03/27/2019 1617   GFRAA 40 (L) 03/27/2019 1617    Imaging, labs and tests noted above have been reviewed independently by me.    Assessment & Plan:   Discussion:  73 year old male never smoker with GERD, chronic renal insufficiency, pancreatic insufficiency and HTN who presents for new consult for pleural effusions. Reportedly he had effusions in October 2019. We reviewed imaging reports from April and October 2020. In April, your CXR had no evidence of pleural effusions demonstrating interval resolution of your prior effusions. Your MRI report mentions tiny bilateral effusions. These effusions can be related to inflammation related to infection or adjacent organs. It could also be a sign of cardiac issues. We will further evaluate with a repeat CXR and an echocardiogram (ultrasound of the heart).  Reviewed CXR. No evidence of effusion. Patient updated. Plan for echocardiogram to evaluate for valvular or filling defect.  Health Maintenance Immunization History  Administered Date(s) Administered  . Influenza Inj Mdck Quad Pf 09/06/2019   CT Lung Screen  Orders Placed This Encounter  Procedures  . [facility] cxr    Standing Status:   Future    Number of Occurrences:   1     Standing Expiration Date:   12/17/2020    Order Specific Question:   Preferred imaging location?    Answer:   [facility]    Order Specific Question:   Reason for exam:    Answer:   pleural effusion  . ECHOCARDIOGRAM COMPLETE    Standing Status:   Future    Standing Expiration Date:   01/17/2021    Order Specific Question:   Where should this test be performed    Answer:   [facility])    Order Specific Question:   Does the patient weigh less than or greater than 250 lbs?    Answer:   Patient weighs less than 250 lbs    Order Specific Question:   Perflutren DEFINITY (image enhancing agent) should be administered unless hypersensitivity or allergy exist    Answer:   Administer Perflutren    Order Specific Question:   Reason for exam-Echo    Answer:   Dyspnea  786.09 / R06.00    Order Specific Question:   Other Comments    Answer:   pleural effusion bilaterally  No orders of the defined types were placed in this encounter.   Return after echocardiogram.  Zyeir Dymek Rodman Pickle, MD [facility] 10/18/2019 12:48 PM  Office Number [phone]

## 2019-10-24 ENCOUNTER — Telehealth: Payer: Self-pay | Admitting: *Deleted

## 2019-10-24 NOTE — Telephone Encounter (Signed)
Called patient to discuss up coming appointment 10/25/19 -  W/Dr Ellyn Hack   patient has upcoming echo on 10/26/19  -  Patient needs an appt after echo - to discuss results   Left message for patient to call back to discuss.

## 2019-10-24 NOTE — Telephone Encounter (Signed)
Patient return call - spoke to scheduler - appointment changed

## 2019-10-25 ENCOUNTER — Ambulatory Visit: Payer: Medicare Other | Admitting: Cardiology

## 2019-10-26 ENCOUNTER — Other Ambulatory Visit: Payer: Self-pay

## 2019-10-26 ENCOUNTER — Telehealth: Payer: Self-pay | Admitting: *Deleted

## 2019-10-26 ENCOUNTER — Ambulatory Visit (HOSPITAL_COMMUNITY): Payer: Medicare Other | Attending: Cardiovascular Disease

## 2019-10-26 DIAGNOSIS — J90 Pleural effusion, not elsewhere classified: Secondary | ICD-10-CM | POA: Insufficient documentation

## 2019-10-26 DIAGNOSIS — R0602 Shortness of breath: Secondary | ICD-10-CM | POA: Diagnosis present

## 2019-10-26 NOTE — Telephone Encounter (Signed)
Per patient wife wasn't feeling well, "they both got Covid 19 testing negative results given this morning."

## 2019-10-30 NOTE — Progress Notes (Signed)
Spoke with pt and notified of results per Dr. Ellison. Pt verbalized understanding and denied any questions. 

## 2019-11-12 ENCOUNTER — Other Ambulatory Visit: Payer: Self-pay

## 2019-11-12 ENCOUNTER — Ambulatory Visit (INDEPENDENT_AMBULATORY_CARE_PROVIDER_SITE_OTHER): Payer: Medicare Other | Admitting: Cardiology

## 2019-11-12 VITALS — BP 113/72 | HR 81 | Ht 69.0 in | Wt 174.0 lb

## 2019-11-12 DIAGNOSIS — R931 Abnormal findings on diagnostic imaging of heart and coronary circulation: Secondary | ICD-10-CM

## 2019-11-12 DIAGNOSIS — I10 Essential (primary) hypertension: Secondary | ICD-10-CM | POA: Diagnosis not present

## 2019-11-12 DIAGNOSIS — N182 Chronic kidney disease, stage 2 (mild): Secondary | ICD-10-CM | POA: Diagnosis not present

## 2019-11-12 DIAGNOSIS — E785 Hyperlipidemia, unspecified: Secondary | ICD-10-CM

## 2019-11-12 NOTE — Patient Instructions (Signed)
Medication Instructions:  NO CHANGES  *If you need a refill on your cardiac medications before your next appointment, please call your pharmacy*  Lab Work: LIPID, CMP -  THIS MONTH   LIPID , HEPATIC IN 3 MONTHS  PRIOR TO YOUR VISIT IN 3 MONTHS- MARCH 2021 If you have labs (blood work) drawn today and your tests are completely normal, you will receive your results only by: Marland Kitchen MyChart Message (if you have MyChart) OR . A paper copy in the mail If you have any lab test that is abnormal or we need to change your treatment, we will call you to review the results.  Testing/Procedures: NOT NEEDED  Follow-Up: At [facility], you and your health needs are our priority.  As part of our continuing mission to provide you with exceptional heart care, we have created designated Provider Care Teams.  These Care Teams include your primary Cardiologist (physician) and Advanced Practice Providers (APPs -  Physician Assistants and Nurse Practitioners) who all work together to provide you with the care you need, when you need it.  Your next appointment:   3 month(s)  The format for your next appointment:   In Person  Provider:   Glenetta Hew, MD  Other Instructions

## 2019-11-12 NOTE — Progress Notes (Signed)
Primary Care Provider: Willey Blade, MD Cardiologist: No primary care provider on file.  Clinic Note: Chief Complaint  Patient presents with  . New Patient (Initial Visit)    Elevated coronary calcium score  . Hypertension    HPI:    Calvin Stephens is a 73 y.o. male with PMH notalbe for GERD, CKD~3 (creatinine roughly 1.5), pancreatic insufficiency & HTN with recently Dx'd bilateral pleural effusions who is being seen today for the evaluation of HYPERTENSION AND ELEVATED CORONARY CALCIUM SCORE/HYPERLIPIDEMIA at the request of Willey Blade, MD.  Pamella Pert was recently seen by several different physicians but but his PCP recently started him on amlodipine and reduce his doxazosin dose because of elevated blood pressures.  He also noted significant constipation with 8 mg of doxazosin.  So far with the addition of amlodipine, his blood pressure is much better.  Recent Hospitalizations: None  Reviewed  CV studies:    The following studies were reviewed today: (if available, images/films reviewed: From Epic Chart or Care Everywhere)  Echo 10/30/2019:  EF 60-65%. Normal LV size & fxn. increased Septal wall thickness (focal basal - sigmoid septum).   Coronary Calcium Score 2015 - 105; 2020 276The following studies were reviewed today: (if available, images/films reviewed: From Epic Chart or Care Everywhere)  Interval History:   Calvin Stephens is here for cardiology evaluation to discuss his blood pressure and as well as his coronary calcium score.  He himself is very active.  He walks several laps around the [place] and has had no issues at all with chest tightness or pressure.  He has maintained a stable weight.  His concern is based on his lipids and his coronary calcium score.  In 2015 the coronary cath score is 105 and now is 276.  He is pretty asymptomatic from my standpoint being very active.  He watches his diet closely.  His blood pressure is doing much better  with the adjustment of his recent medications.  CV Review of Symptoms (Summary) no chest pain or dyspnea on exertion negative for - edema, irregular heartbeat, orthopnea, palpitations, paroxysmal nocturnal dyspnea, rapid heart rate, shortness of breath or Syncope/near syncope, TIA/amaurosis fugax, claudication.  The patient does not have symptoms concerning for COVID-19 infection (fever, chills, cough, or new shortness of breath).  The patient is practicing social distancing. ++ Masking.  He does go out for groceries/shopping.  Is still at work, but being safe   REVIEWED OF SYSTEMS   A comprehensive ROS was performed. Review of Systems  Constitutional: Negative for chills, diaphoresis, fever and malaise/fatigue.  HENT: Negative for congestion and nosebleeds.   Respiratory: Negative for cough and shortness of breath.   Gastrointestinal: Positive for constipation (Related to increased dose of doxazosin.  Better with reduced dose.).  Musculoskeletal: Negative for falls and joint pain.  Neurological: Negative for dizziness and headaches.  Psychiatric/Behavioral: Negative for memory loss. The patient is not nervous/anxious and does not have insomnia.   All other systems reviewed and are negative.   I have reviewed and (if needed) personally updated the patient's problem list, medications, allergies, past medical and surgical history, social and family history.   PAST MEDICAL HISTORY   Past Medical History:  Diagnosis Date  . Agatston coronary artery calcium score between 200 and 399 07/2019   Coronary calcium score 276 (up from 115 in 2015)  . Hypertension   . Multiple gastric ulcers   . Neuropathy   . Polyneuropathy   . RBBB (right bundle  branch block)    incomplete, 1st degree AV Block  . Renal disorder      PAST SURGICAL HISTORY   Past Surgical History:  Procedure Laterality Date  . APPENDECTOMY    . HERNIA REPAIR Left    inguinal   . KNEE ARTHROSCOPY Right    x3  .  SHOULDER SURGERY    . TRANSTHORACIC ECHOCARDIOGRAM  10/2019   EF 60-65%. Normal LV size & fxn. increased Septal wall thickness (focal basal - sigmoid septum).     MEDICATIONS/ALLERGIES   Current Meds  Medication Sig  . amLODipine (NORVASC) 5 MG tablet Take 5 mg by mouth daily.  Marland Kitchen. aspirin EC 81 MG tablet Take 81 mg by mouth at bedtime.   Marland Kitchen. doxazosin (CARDURA) 4 MG tablet Take 4 mg by mouth daily.   Marland Kitchen. gabapentin (NEURONTIN) 300 MG capsule Take 2 capsules (600 mg total) by mouth 2 (two) times daily. (Patient taking differently: Take 600 mg by mouth 3 (three) times daily. )  . lansoprazole (PREVACID) 15 MG capsule Take 15 mg by mouth 2 (two) times daily before a meal.  . LORazepam (ATIVAN) 1 MG tablet Take 1 mg by mouth 2 (two) times daily as needed for anxiety.   Marland Kitchen. losartan (COZAAR) 100 MG tablet Take 100 mg by mouth daily.  . Multiple Vitamin (MULTIVITAMIN WITH MINERALS) TABS tablet Take 1 tablet by mouth daily.  Marland Kitchen. triamcinolone cream (KENALOG) 0.1 % Apply 1 application topically 2 (two) times daily as needed (skin care).  Marland Kitchen. ZENPEP 25000-79000 units CPEP Take 1-2 capsules by mouth See admin instructions. Take one capsule at lunch and two capsules at dinner, may add one addition capsule for heavy meals or red meat consumption.    No Known Allergies   SOCIAL HISTORY/FAMILY HISTORY   Social History   Tobacco Use  . Smoking status: Never Smoker  . Smokeless tobacco: Never Used  Substance Use Topics  . Alcohol use: Yes    Alcohol/week: 2.0 standard drinks    Types: 2 Glasses of wine per week  . Drug use: Never   Social History   Social History Narrative   College educated: MD, MBA   Lives with significant other.   No caffeine use.   Right-handed.   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  We will bring the delay because of Covid.  Head of  For  [employer] in [place] [employer] - no international travel during service He has Lived in various places  No childhood asthma   In October 2019, he returned from [place] for work and presented to ED for URI and demonstrated bilateraly lower lobe effusions  Family History family history includes Hypertension in his father; Kidney disease in his mother.   OBJCTIVE -PE, EKG, labs   Wt Readings from Last 3 Encounters:  11/12/19 174 lb (78.9 kg)  10/18/19 177 lb (80.3 kg)  08/22/19 174 lb 8 oz (79.2 kg)    Physical Exam: BP 113/72   Pulse 81   Ht 5\' 9"  (1.753 m)   Wt 174 lb (78.9 kg)   SpO2 96%   BMI 25.70 kg/m  Physical Exam  Constitutional: He is oriented to person, place, and time. He appears well-developed and well-nourished. No distress.  Pleasant, healthy-appearing gentleman.  Well-groomed.  HENT:  Head: Normocephalic and atraumatic.  Eyes: Pupils are equal, round, and  reactive to light. Conjunctivae and EOM are normal.  Neck: No hepatojugular reflux and no JVD present. Carotid bruit is not present.  Cardiovascular: Normal rate, regular rhythm, normal heart sounds, intact distal pulses and normal pulses.  No extrasystoles are present. PMI is not displaced. Exam reveals no gallop and no friction rub.  No murmur heard. Pulmonary/Chest: Effort normal and breath sounds normal. No respiratory distress. He has no wheezes.  Abdominal: Soft. Bowel sounds are normal. He exhibits no distension. There is no abdominal tenderness. There is no rebound.  Musculoskeletal:        General: No edema. Normal range of motion.     Cervical back: Normal range of motion and neck supple.  Neurological: He is alert and oriented to person, place, and time. No cranial nerve deficit.  Skin: Skin is warm and dry. No erythema.  Psychiatric: He has a normal mood and affect. His behavior is normal. Judgment and thought content normal.  Vitals reviewed.    Adult ECG Report  Rate: 74  ;  Rhythm: normal sinus rhythm and Incomplete right bundle branch block.  Otherwise normal axis, intervals and durations.;   Narrative Interpretation: Normal EKG  Recent Labs:   No results found for: CHOL, HDL, LDLCALC, LDLDIRECT, TRIG, CHOLHDL Lab Results  Component Value Date   CREATININE 1.87 (H) 03/27/2019   BUN 23 03/27/2019   NA 137 03/27/2019   K 4.4 03/27/2019   CL 104 03/27/2019   CO2 22 03/27/2019    ASSESSMENT/PLAN   Problem List Items Addressed This Visit    Agatston coronary artery calcium score between 200 and 399 (Chronic)    Pretty high coronary calcium score but asymptomatic.  For now would simply treat medically.  This would require following up lipid panel and treating accordingly.  Continue aggressive blood pressure management.   Monitor glycemic control. Continue to stay active and exercise as noted will report symptoms.      Relevant Orders   EKG 12-Lead (Completed)   Lipid panel   Comprehensive metabolic panel   Hepatic function panel   Lipid panel   Chronic renal insufficiency - Primary (Chronic)    Agree with the addition of amlodipine for blood pressure control.  Is already on ARB.  I suspect that this is been no longer standing and therefore not related to being on ARB.      Relevant Orders   Lipid panel   Comprehensive metabolic panel   Hepatic function panel   Lipid panel   Essential hypertension (Chronic)    Blood pressure actually is pretty good on current meds.  He is worried about his diastolic pressures which are back now on a stable range.  I reassured him that these labs look great.  I am happy that the decision was made to add amlodipine as opposed to going back up on doxazosin.  On ARB now with him noted pain and pressure well controlled.      Relevant Orders   EKG 12-Lead (Completed)   Lipid panel   Comprehensive metabolic panel   Hepatic function panel   Lipid panel   Dyslipidemia, goal LDL below 70 (Chronic)    With  elevated coronary calcium score, will need to reassess liver function.  Will check liver panel now and likely initiate treatment with statin depending on the results.    We will then recheck in roughly 3 to 4 months.      Relevant Orders   Lipid panel   Comprehensive metabolic panel  Hepatic function panel   Lipid panel      COVID-19 Education: The signs and symptoms of COVID-19 were discussed with the patient and how to seek care for testing (follow up with PCP or arrange E-visit).   The importance of social distancing was discussed today.  I spent a total of 26 minutes with the patient and chart review. >  50% of the time was spent in direct patient consultation.  Additional time spent with chart review (studies, outside notes, etc): 10 Total Time: 36 min  Current medicines are reviewed at length with the patient today.  (+/- concerns) ? How to treat lipids  Patient Instructions / Medication Changes & Studies & Tests Ordered   Patient Instructions  Medication Instructions:  NO CHANGES  *If you need a refill on your cardiac medications before your next appointment, please call your pharmacy*  Lab Work: LIPID, CMP -  THIS MONTH   LIPID , HEPATIC IN 3 MONTHS  PRIOR TO YOUR VISIT IN 3 MONTHS- MARCH 2021 If you have labs (blood work) drawn today and your tests are completely normal, you will receive your results only by: Marland Kitchen MyChart Message (if you have MyChart) OR . A paper copy in the mail If you have any lab test that is abnormal or we need to change your treatment, we will call you to review the results.  Testing/Procedures: NOT NEEDED  Follow-Up: At [facility], you and your health needs are our priority.  As part of our continuing mission to provide you with exceptional heart care, we have created designated Provider Care Teams.  These Care Teams include your primary Cardiologist (physician) and Advanced Practice Providers (APPs -  Physician Assistants and Nurse  Practitioners) who all work together to provide you with the care you need, when you need it.  Your next appointment:   3 month(s)  The format for your next appointment:   In Person  Provider:   Bryan Lemma, MD  Other Instructions   Studies Ordered:   Orders Placed This Encounter  Procedures  . Lipid panel  . Comprehensive metabolic panel  . Hepatic function panel  . Lipid panel  . EKG 12-Lead     Bryan Lemma, M.D., M.S. Interventional Cardiologist   Pager # [phone] Phone # [phone] [address] [place], [place] [address]   Thank you for choosing [facility] at [facility]!!

## 2019-11-15 ENCOUNTER — Encounter: Payer: Self-pay | Admitting: Cardiology

## 2019-11-15 DIAGNOSIS — E785 Hyperlipidemia, unspecified: Secondary | ICD-10-CM | POA: Insufficient documentation

## 2019-11-15 NOTE — Assessment & Plan Note (Signed)
Agree with the addition of amlodipine for blood pressure control.  Is already on ARB.  I suspect that this is been no longer standing and therefore not related to being on ARB.

## 2019-11-15 NOTE — Assessment & Plan Note (Signed)
Blood pressure actually is pretty good on current meds.  He is worried about his diastolic pressures which are back now on a stable range.  I reassured him that these labs look great.  I am happy that the decision was made to add amlodipine as opposed to going back up on doxazosin.  On ARB now with him noted pain and pressure well controlled.

## 2019-11-15 NOTE — Assessment & Plan Note (Signed)
Pretty high coronary calcium score but asymptomatic.  For now would simply treat medically.  This would require following up lipid panel and treating accordingly.  Continue aggressive blood pressure management.   Monitor glycemic control. Continue to stay active and exercise as noted will report symptoms.

## 2019-11-15 NOTE — Assessment & Plan Note (Signed)
With elevated coronary calcium score, will need to reassess liver function.  Will check liver panel now and likely initiate treatment with statin depending on the results.    We will then recheck in roughly 3 to 4 months.

## 2019-11-16 LAB — LIPID PANEL
Chol/HDL Ratio: 3.2 ratio (ref 0.0–5.0)
Cholesterol, Total: 152 mg/dL (ref 100–199)
HDL: 48 mg/dL (ref >39)
LDL Chol Calc (NIH): 87 mg/dL (ref 0–99)
Triglycerides: 91 mg/dL (ref 0–149)
VLDL Cholesterol Cal: 17 mg/dL (ref 5–40)

## 2019-11-16 LAB — COMPREHENSIVE METABOLIC PANEL
ALT: 12 IU/L (ref 0–44)
AST: 17 IU/L (ref 0–40)
Albumin/Globulin Ratio: 2 (ref 1.2–2.2)
Albumin: 4.4 g/dL (ref 3.7–4.7)
Alkaline Phosphatase: 99 IU/L (ref 39–117)
BUN/Creatinine Ratio: 13 (ref 10–24)
BUN: 24 mg/dL (ref 8–27)
Bilirubin Total: 0.9 mg/dL (ref 0.0–1.2)
CO2: 22 mmol/L (ref 20–29)
Calcium: 9.6 mg/dL (ref 8.6–10.2)
Chloride: 102 mmol/L (ref 96–106)
Creatinine, Ser: 1.81 mg/dL — ABNORMAL HIGH (ref 0.76–1.27)
GFR calc Af Amer: 42 mL/min/{1.73_m2} — ABNORMAL LOW (ref >59)
GFR calc non Af Amer: 36 mL/min/{1.73_m2} — ABNORMAL LOW (ref >59)
Globulin, Total: 2.2 g/dL (ref 1.5–4.5)
Glucose: 89 mg/dL (ref 65–99)
Potassium: 5.2 mmol/L (ref 3.5–5.2)
Sodium: 136 mmol/L (ref 134–144)
Total Protein: 6.6 g/dL (ref 6.0–8.5)

## 2019-11-26 ENCOUNTER — Other Ambulatory Visit (HOSPITAL_COMMUNITY): Payer: Self-pay | Admitting: Internal Medicine

## 2019-11-26 ENCOUNTER — Other Ambulatory Visit: Payer: Self-pay | Admitting: Internal Medicine

## 2019-11-26 DIAGNOSIS — R1011 Right upper quadrant pain: Secondary | ICD-10-CM

## 2019-12-03 ENCOUNTER — Telehealth: Payer: Self-pay | Admitting: Cardiology

## 2019-12-03 NOTE — Telephone Encounter (Signed)
Spoke with patient. Patient reports blood pressures having been running high. Yesterday 135/89, today 140/100.  Patient c/o headache.  Reports he has leg swelling yesterday to which he took a prn lasix which helped a little. Patient would like information sent to MD for review. Message routed to Dr. Ellyn Hack.

## 2019-12-03 NOTE — Telephone Encounter (Signed)
  Pt c/o medication issue:  1. Name of Medication:  amLODipine (NORVASC) 5 MG tablet doxazosin (CARDURA) 4 MG tablet losartan (COZAAR) 100 MG tablet  2. How are you currently taking this medication (dosage and times per day)? Once a day  3. Are you having a reaction (difficulty breathing--STAT)? no  4. What is your medication issue? Patient states his BP is not going down and he would like a call back on adjusting the medication.

## 2019-12-04 NOTE — Telephone Encounter (Signed)
If this continues to be the issue with his pressures being just a little bit elevated and having some's mild swelling, I would rather add standing dose of HCTZ 25 mg daily.

## 2019-12-05 MED ORDER — HYDROCHLOROTHIAZIDE 25 MG PO TABS: 25.0000 mg | ORAL_TABLET | Freq: Every day | ORAL | 3 refills | Status: AC

## 2019-12-05 NOTE — Telephone Encounter (Signed)
Dr. Ellyn Hack recommends adding HCTZ 25mg  to patient's medication regimen due to continued hypertension.   Spoke with patient, patient verbalized understanding.   Prescription sent to pharmacy.

## 2019-12-06 ENCOUNTER — Other Ambulatory Visit: Payer: Self-pay

## 2019-12-06 ENCOUNTER — Ambulatory Visit (HOSPITAL_COMMUNITY)
Admission: RE | Admit: 2019-12-06 | Discharge: 2019-12-06 | Disposition: A | Discharge status: Home / Self Care | Payer: Medicare Other | Source: Ambulatory Visit | Attending: Internal Medicine | Admitting: Internal Medicine

## 2019-12-06 DIAGNOSIS — R1011 Right upper quadrant pain: Secondary | ICD-10-CM | POA: Insufficient documentation

## 2019-12-06 MED ORDER — TECHNETIUM TC 99M MEBROFENIN IV KIT
5.1000 | PACK | Freq: Once | INTRAVENOUS | Status: AC | PRN
  Administered 2019-12-06: 5.1 via INTRAVENOUS

## 2019-12-10 ENCOUNTER — Telehealth: Payer: Self-pay

## 2019-12-10 NOTE — Telephone Encounter (Signed)
Patient called in regards to a [facility] bill he received. Patient states it was coded as not medically  Necessary (HDB A1C) and would like to know who he can speak to about it.

## 2019-12-10 NOTE — Telephone Encounter (Signed)
I called [facility] [phone] - spoke to [facility]) and provided appropriate, additional ICD10 codes that will cover the cost of the lab.  [facility] is going to re-file the bill with Medicare and the patient should disregard the current charges.  I have also called the patient to provide them with this update.

## 2019-12-13 ENCOUNTER — Other Ambulatory Visit: Payer: Self-pay | Admitting: Internal Medicine

## 2019-12-14 ENCOUNTER — Other Ambulatory Visit: Payer: Self-pay | Admitting: Internal Medicine

## 2019-12-14 DIAGNOSIS — R1011 Right upper quadrant pain: Secondary | ICD-10-CM

## 2019-12-20 ENCOUNTER — Other Ambulatory Visit: Payer: Self-pay

## 2019-12-20 ENCOUNTER — Other Ambulatory Visit: Payer: Self-pay | Admitting: Chiropractor

## 2019-12-20 ENCOUNTER — Ambulatory Visit
Admission: RE | Admit: 2019-12-20 | Discharge: 2019-12-20 | Disposition: A | Discharge status: Home / Self Care | Payer: Medicare Other | Source: Ambulatory Visit | Attending: Chiropractor | Admitting: Chiropractor

## 2019-12-20 DIAGNOSIS — M542 Cervicalgia: Secondary | ICD-10-CM

## 2019-12-20 DIAGNOSIS — M545 Low back pain, unspecified: Secondary | ICD-10-CM

## 2019-12-24 ENCOUNTER — Other Ambulatory Visit: Payer: Medicare Other

## 2020-01-03 ENCOUNTER — Other Ambulatory Visit: Payer: Self-pay | Admitting: Internal Medicine

## 2020-01-03 DIAGNOSIS — R1011 Right upper quadrant pain: Secondary | ICD-10-CM

## 2020-01-08 ENCOUNTER — Other Ambulatory Visit: Payer: Medicare Other

## 2020-02-14 ENCOUNTER — Ambulatory Visit: Payer: Medicare Other | Admitting: Cardiology

## 2021-05-12 IMAGING — NM NM HEPATO W/GB/PHARM/[PERSON_NAME]
2 series · 12 of 12 positions shown · non-contrast
Comparison: None.

CLINICAL DATA: Chronic right upper quadrant pain for 2 years.
Intermittent diarrhea.

EXAM:
NUCLEAR MEDICINE HEPATOBILIARY IMAGING WITH GALLBLADDER EF
TECHNIQUE: Sequential images of the abdomen were obtained [DATE] minutes
following intravenous administration of radiopharmaceutical. After
oral ingestion of Ensure, gallbladder ejection fraction was
determined. At 60 min, normal ejection fraction is greater than 33%.
RADIOPHARMACEUTICALS:  5.1 mCi Hc-XXm  Choletec IV

[he hepatobiliary · 4.52mm/px · 6 of 60 frames shown (1 of 2)]
[frame 6/60]
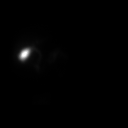
[frame 16/60]
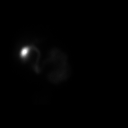
[frame 26/60]
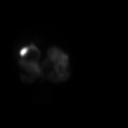
[frame 36/60]
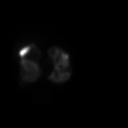
[frame 46/60]
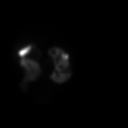
[frame 56/60]
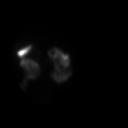

[he hepatobiliary · 4.52mm/px · 6 of 60 frames shown (2 of 2)]
[frame 6/60]
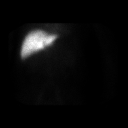
[frame 16/60]
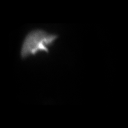
[frame 26/60]
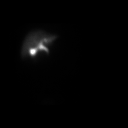
[frame 36/60]
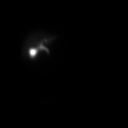
[frame 46/60]
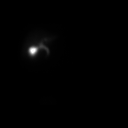
[frame 56/60]
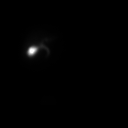

[12 of 12 positions shown; findings below may reference images not displayed]

FINDINGS: Prompt uptake and biliary excretion of activity by the liver is
seen. Gallbladder activity is visualized, consistent with patency of
cystic duct. Biliary activity passes into small bowel, consistent
with patent common bile duct.

Calculated gallbladder ejection fraction is 66%. (Normal gallbladder
ejection fraction with Ensure is greater than 33%.)
IMPRESSION: Normal hepatobiliary scan, demonstrating patency of cystic and
common bile ducts.

Normal gallbladder ejection fraction.

## 2021-05-26 IMAGING — CR DG LUMBAR SPINE 2-3V
3 series · 3 of 3 positions shown · non-contrast
Comparison: None.

CLINICAL DATA: Low back pain.

EXAM:
LUMBAR SPINE - 2-3 VIEW

[w lumbar spine lat (1 of 2)]
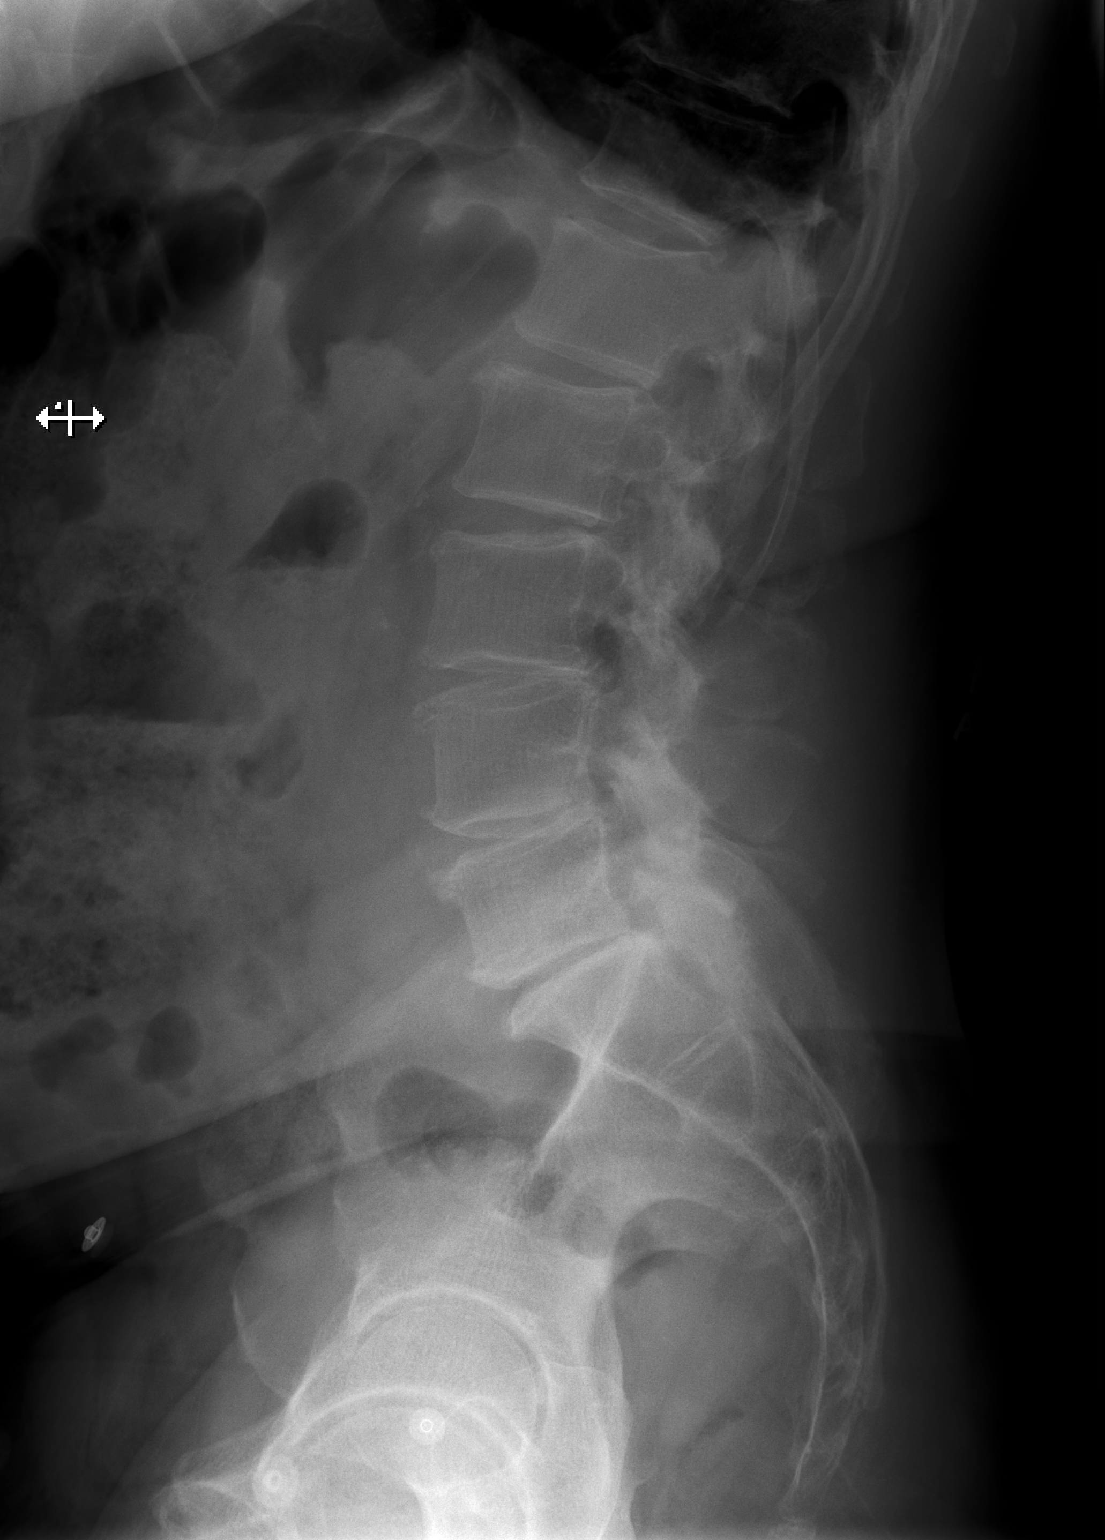

[w lumbar spine lat (2 of 2)]
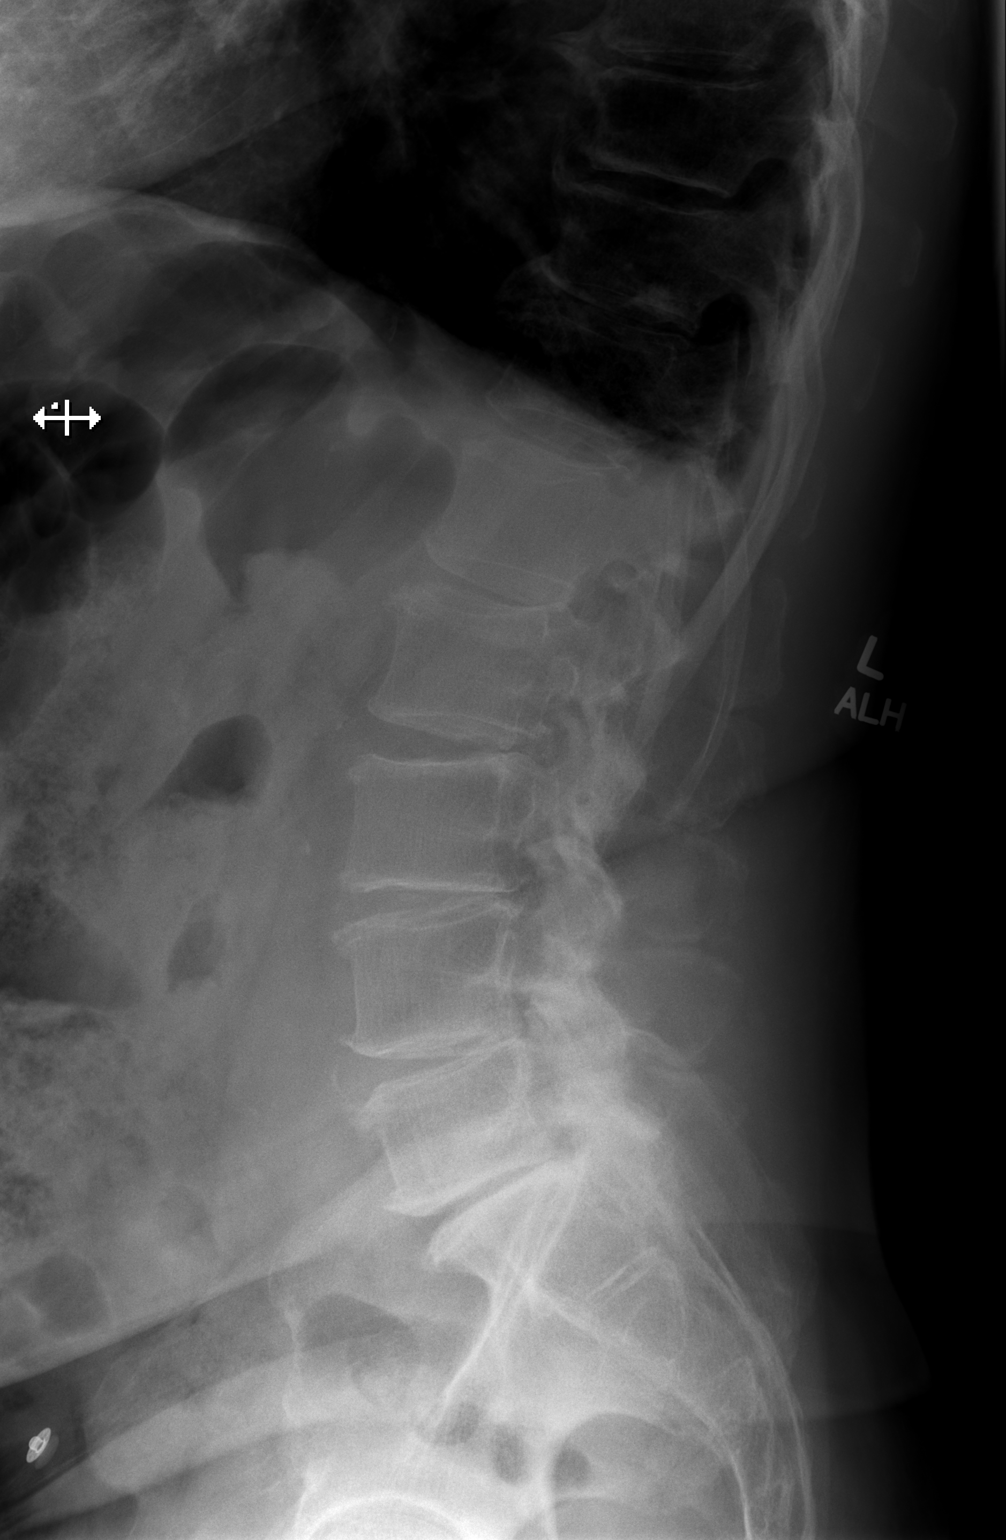

[w lumbar spine ap]
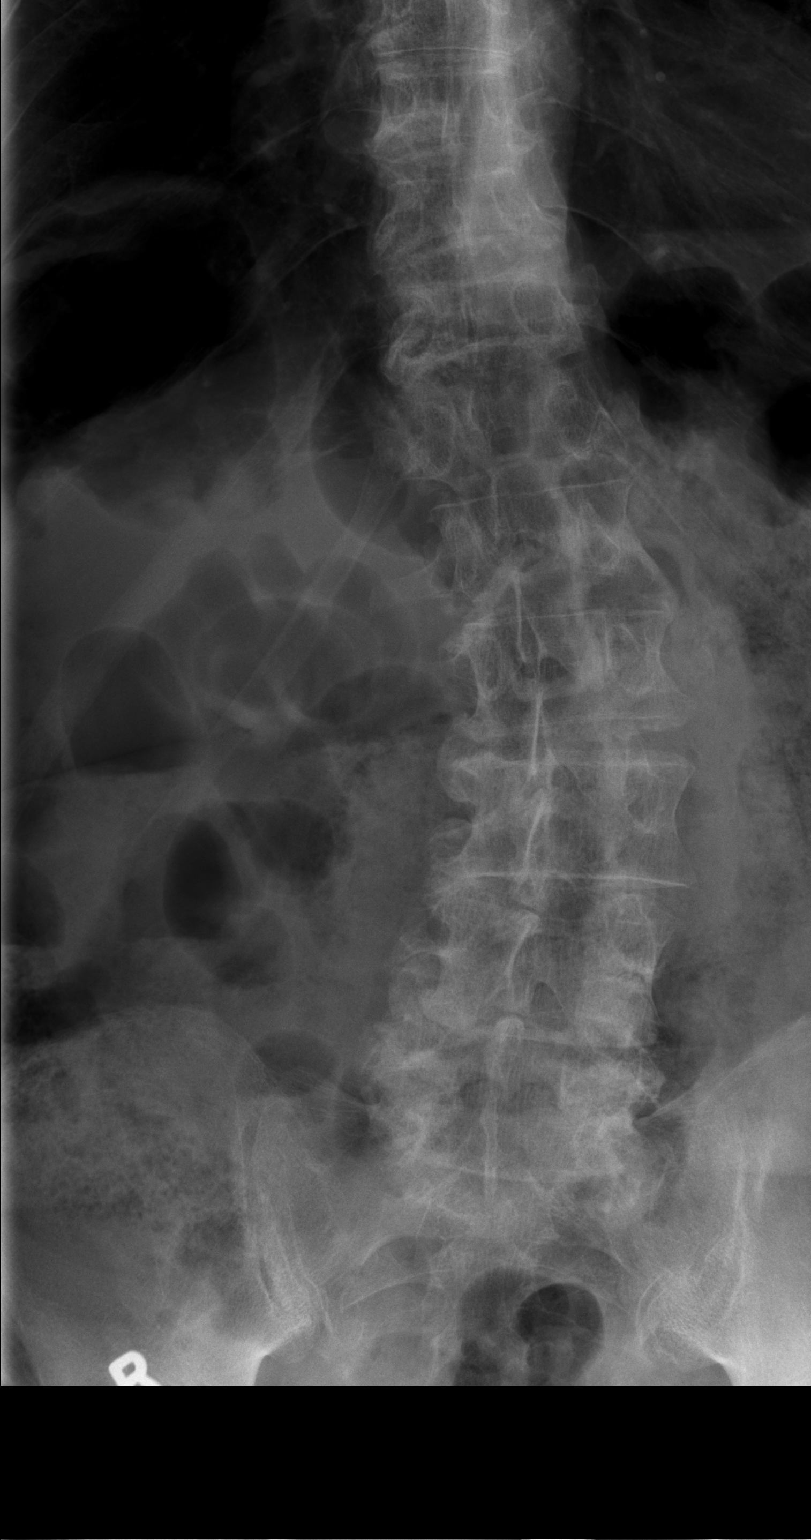

[3 of 3 positions shown; findings below may reference images not displayed]

FINDINGS: Mild levoscoliosis of lumbar spine is noted. No fracture or
significant spondylolisthesis is noted. Moderate degenerative disc
disease is noted at L1-2, L3-4, L4-5 and L5-S1.
IMPRESSION: Moderate multilevel degenerative disc disease. No acute abnormality
seen in the lumbar spine.
